# Patient Record
Sex: Female | Born: 1980 | Race: Black or African American | Hispanic: No | Marital: Single | Smoking: Never smoker
Health system: Southern US, Community
[De-identification: ages and names within clinical notes are randomized; demographics above are authoritative.]

---

## 2014-03-27 ENCOUNTER — Emergency Department (HOSPITAL_COMMUNITY)
Admission: EM | Admit: 2014-03-27 | Discharge: 2014-03-27 | Disposition: A | Discharge status: Home / Self Care | Payer: Self-pay | Attending: Emergency Medicine | Admitting: Emergency Medicine

## 2014-03-27 ENCOUNTER — Emergency Department (HOSPITAL_COMMUNITY): Payer: Self-pay

## 2014-03-27 ENCOUNTER — Encounter (HOSPITAL_COMMUNITY): Payer: Self-pay | Admitting: Emergency Medicine

## 2014-03-27 DIAGNOSIS — Z3202 Encounter for pregnancy test, result negative: Secondary | ICD-10-CM | POA: Insufficient documentation

## 2014-03-27 DIAGNOSIS — M542 Cervicalgia: Secondary | ICD-10-CM | POA: Insufficient documentation

## 2014-03-27 DIAGNOSIS — N6002 Solitary cyst of left breast: Secondary | ICD-10-CM | POA: Insufficient documentation

## 2014-03-27 DIAGNOSIS — K219 Gastro-esophageal reflux disease without esophagitis: Secondary | ICD-10-CM | POA: Insufficient documentation

## 2014-03-27 DIAGNOSIS — M79621 Pain in right upper arm: Secondary | ICD-10-CM | POA: Insufficient documentation

## 2014-03-27 DIAGNOSIS — N644 Mastodynia: Secondary | ICD-10-CM

## 2014-03-27 DIAGNOSIS — M79622 Pain in left upper arm: Secondary | ICD-10-CM | POA: Insufficient documentation

## 2014-03-27 LAB — COMPREHENSIVE METABOLIC PANEL
ALK PHOS: 38 U/L — AB (ref 39–117)
ALT: 19 U/L (ref 0–35)
ANION GAP: 12 (ref 5–15)
AST: 25 U/L (ref 0–37)
Albumin: 3.8 g/dL (ref 3.5–5.2)
BUN: 12 mg/dL (ref 6–23)
CO2: 25 meq/L (ref 19–32)
Calcium: 9.1 mg/dL (ref 8.4–10.5)
Chloride: 103 mEq/L (ref 96–112)
Creatinine, Ser: 0.74 mg/dL (ref 0.50–1.10)
Glucose, Bld: 96 mg/dL (ref 70–99)
Potassium: 4 mEq/L (ref 3.7–5.3)
SODIUM: 140 meq/L (ref 137–147)
Total Bilirubin: 0.6 mg/dL (ref 0.3–1.2)
Total Protein: 7.4 g/dL (ref 6.0–8.3)

## 2014-03-27 LAB — CBC WITH DIFFERENTIAL/PLATELET
Basophils Absolute: 0 10*3/uL (ref 0.0–0.1)
Basophils Relative: 0 % (ref 0–1)
Eosinophils Absolute: 0.1 10*3/uL (ref 0.0–0.7)
Eosinophils Relative: 1 % (ref 0–5)
HEMATOCRIT: 38.7 % (ref 36.0–46.0)
Hemoglobin: 12.2 g/dL (ref 12.0–15.0)
LYMPHS PCT: 38 % (ref 12–46)
Lymphs Abs: 1.7 10*3/uL (ref 0.7–4.0)
MCH: 29.5 pg (ref 26.0–34.0)
MCHC: 31.5 g/dL (ref 30.0–36.0)
MCV: 93.7 fL (ref 78.0–100.0)
Monocytes Absolute: 0.3 10*3/uL (ref 0.1–1.0)
Monocytes Relative: 7 % (ref 3–12)
Neutro Abs: 2.5 10*3/uL (ref 1.7–7.7)
Neutrophils Relative %: 54 % (ref 43–77)
PLATELETS: 257 10*3/uL (ref 150–400)
RBC: 4.13 MIL/uL (ref 3.87–5.11)
RDW: 12.5 % (ref 11.5–15.5)
WBC: 4.6 10*3/uL (ref 4.0–10.5)

## 2014-03-27 LAB — POC URINE PREG, ED: Preg Test, Ur: NEGATIVE

## 2014-03-27 LAB — TROPONIN I: Troponin I: <0.3 ng/mL (ref <0.30)

## 2014-03-27 NOTE — Discharge Instructions (Signed)
Please call your doctor for a followup appointment within 24-48 hours. When you talk to your doctor please let them know that you were seen in the emergency department and have them acquire all of your records so that they can discuss the findings with you and formulate a treatment plan to fully care for your new and ongoing problems. Please call [facility] to schedule an appointment for ultrasounds to be performed Please call and set-up an appointment with health and wellness center  Please rest and stay hydrated Please closely monitor the lesion of the left breast - if anything changes please report back to the ED Please continue to monitor symptoms closely and if symptoms are to worsen or change (fever greater than 101, chills, sweating, nausea, vomiting, chest pain, shortness of breathe, difficulty breathing, weakness, numbness, tingling, worsening or changes to pain pattern, breast swelling, drainage, redness, hot to the touch, changes to breast appearance and size, increased size of the cyst on the breast) please report back to the Emergency Department immediately.     Breast Cyst A breast cyst is a sac in the breast that is filled with fluid. Breast cysts are common in women. Women can have one or many cysts. When the breasts contain many cysts, it is usually due to a noncancerous (benign) condition called fibrocystic change. These lumps form under the influence of female hormones (estrogen and progesterone). The lumps are most often located in the upper, outer portion of the breast. They are often more swollen, painful, and tender before your period starts. They usually disappear after menopause, unless you are on hormone therapy.  There are several types of cysts:  Macrocyst. This is a cyst that is about 2 in. (5.1 cm) in diameter.   Microcyst. This is a tiny cyst that you cannot feel but can be seen with a mammogram or an ultrasound.   Galactocele. This is a cyst  containing milk that may develop if you suddenly stop breastfeeding.   Sebaceous cyst of the skin. This type of cyst is not in the breast tissue itself. Breast cysts do not increase your risk of breast cancer. However, they must be monitored closely because they can be cancerous.  CAUSES  It is not known exactly what causes a breast cyst to form. Possible causes include:  An overgrowth of milk glands and connective tissue in the breast can block the milk glands, causing them to fill with fluid.   Scar tissue in the breast from previous surgery may block the glands, causing a cyst.  RISK FACTORS Estrogen may influence the development of a breast cyst.  SIGNS AND SYMPTOMS   Feeling a smooth, round, soft lump (like a grape) in the breast that is easily moveable.   Breast discomfort or pain.  Increase in size of the lump before your menstrual period and decrease in its size after your menstrual period.  DIAGNOSIS  A cyst can be felt during a physical exam by your health care provider. A breast X-ray exam (mammogram) and ultrasonography will be done to confirm the diagnosis. Fluid may be removed from the cyst with a needle (fine needle aspiration) to make sure the cyst is not cancerous.  TREATMENT  Treatment may not be necessary. Your health care provider may monitor the cyst to see if it goes away on its own. If treatment is needed, it may include:  Hormone treatment.   Needle aspiration. There is a chance of the cyst coming back after aspiration.  Surgery to remove the whole cyst.  HOME CARE INSTRUCTIONS   Keep all follow-up appointments with your health care provider.  See your health care provider regularly:  Get a yearly exam by your health care provider.  Have a clinical breast exam by a health care provider every 1-3 years if you are 15-23 years of age. After age 18 years, you should have the exam every year.   Get mammogram tests as directed by your health care  provider.   Understand the normal appearance and feel of your breasts and perform breast self-exams.   Only take over-the-counter or prescription medicines as directed by your health care provider.   Wear a supportive bra, especially when exercising.   Avoid caffeine.   Reduce your salt intake, especially before your menstrual period. Too much salt can cause fluid retention, breast swelling, and discomfort.  SEEK MEDICAL CARE IF:   You feel, or think you feel, a lump in your breast.   You notice that both breasts look or feel different than usual.   Your breast is still causing pain after your menstrual period is over.   You need medicine for breast pain and swelling that occurs with your menstrual period.  SEEK IMMEDIATE MEDICAL CARE IF:   You have severe pain, tenderness, redness, or warmth in your breast.   You have nipple discharge or bleeding.   Your breast lump becomes hard and painful.   You find new lumps or bumps that were not there before.   You feel lumps in your armpit (axilla).   You notice dimpling or wrinkling of the breast or nipple.   You have a fever.  MAKE SURE YOU:  Understand these instructions.  Will watch your condition.  Will get help right away if you are not doing well or get worse. Document Released: 03/28/2005 Document Revised: 11/28/2012 Document Reviewed: 10/25/2012 [facility] Patient Information 2015 [place], [place]. This information is not intended to replace advice given to you by your health care provider. Make sure you discuss any questions you have with your health care provider.   Food Choices for Gastroesophageal Reflux Disease When you have gastroesophageal reflux disease (GERD), the foods you eat and your eating habits are very important. Choosing the right foods can help ease the discomfort of GERD. WHAT GENERAL GUIDELINES DO I NEED TO FOLLOW?  Choose fruits, vegetables, whole grains, low-fat dairy products, and  low-fat meat, fish, and poultry.  Limit fats such as oils, salad dressings, butter, nuts, and avocado.  Keep a food diary to identify foods that cause symptoms.  Avoid foods that cause reflux. These may be different for different people.  Eat frequent small meals instead of three large meals each day.  Eat your meals slowly, in a relaxed setting.  Limit fried foods.  Cook foods using methods other than frying.  Avoid drinking alcohol.  Avoid drinking large amounts of liquids with your meals.  Avoid bending over or lying down until 2-3 hours after eating. WHAT FOODS ARE NOT RECOMMENDED? The following are some foods and drinks that may worsen your symptoms: Vegetables Tomatoes. Tomato juice. Tomato and spaghetti sauce. Chili peppers. Onion and garlic. Horseradish. Fruits Oranges, grapefruit, and lemon (fruit and juice). Meats High-fat meats, fish, and poultry. This includes hot dogs, ribs, ham, sausage, salami, and bacon. Dairy Whole milk and chocolate milk. Sour cream. Cream. Butter. Ice cream. Cream cheese.  Beverages Coffee and tea, with or without caffeine. Carbonated beverages or energy drinks. Condiments Hot sauce. Barbecue  sauce.  Sweets/Desserts Chocolate and cocoa. Donuts. Peppermint and spearmint. Fats and Oils High-fat foods, including [place] fries and potato chips. Other Vinegar. Strong spices, such as black pepper, white pepper, red pepper, cayenne, curry powder, cloves, ginger, and chili powder. The items listed above may not be a complete list of foods and beverages to avoid. Contact your dietitian for more information. Document Released: 03/28/2005 Document Revised: 04/02/2013 Document Reviewed: 01/30/2013 [facility] Patient Information 2015 [place]. This information is not intended to replace advice given to you by your health care provider. Make sure you discuss any questions you have with your health care provider.    Emergency Department Resource  Guide 1) Find a Doctor and Pay Out of Pocket Although you won't have to find out who is covered by your insurance plan, it is a good idea to ask around and get recommendations. You will then need to call the office and see if the doctor you have chosen will accept you as a new patient and what types of options they offer for patients who are self-pay. Some doctors offer discounts or will set up payment plans for their patients who do not have insurance, but you will need to ask so you aren't surprised when you get to your appointment.  2) Contact Your Local Health Department Not all health departments have doctors that can see patients for sick visits, but many do, so it is worth a call to see if yours does. If you don't know where your local health department is, you can check in your phone book. The CDC also has a tool to help you locate your state's health department, and many state websites also have listings of all of their local health departments.  3) Find a Walk-in Clinic If your illness is not likely to be very severe or complicated, you may want to try a walk in clinic. These are popping up all over the country in pharmacies, drugstores, and shopping centers. They're usually staffed by nurse practitioners or physician assistants that have been trained to treat common illnesses and complaints. They're usually fairly quick and inexpensive. However, if you have serious medical issues or chronic medical problems, these are probably not your best option.  No Primary Care Doctor: - Call Health Connect at  [phone] - they can help you locate a primary care doctor that  accepts your insurance, provides certain services, etc. - Physician Referral Service- [phone]  Chronic Pain Problems: Organization         Address  Phone   Notes  [facility]  [phone] Patients need to be referred by their primary care doctor.   Medication Assistance: Organization          Address  Phone   Notes  [facility] [address] [place], [place] [address] [phone] --Must be a resident of [facility] [place] [facility] -- Must have NO insurance coverage whatsoever (no Medicaid/ Medicare, etc.) -- The pt. MUST have a primary care doctor that directs their care regularly and follows them in the community   MedAssist  [phone]   [employer]  [phone]    Agencies that provide inexpensive medical care: Organization         Address  Phone   Notes  [facility]  [phone]   [facility]    [phone]   [facility] [address] [place], [place] [address] [phone]   [facility] [address] [place] [phone]   Planned Parenthood    [phone]   [facility]    [phone]   [facility]  [address], [place] Phone:  [phone], Fax:  [phone] Hours of Operation:  9 am - 6 pm, M-F.  Also accepts Medicaid/Medicare and self-pay.  [facility]  [address], [place] Phone: [phone], Fax: [phone]. Hours of Operation:  8:30 am - 5:30 pm, M-F.  Also accepts Medicaid and self-pay.  [facility] [place] [address], [place] Phone: [phone]   [facility] [address] Natasha Bence [place], [place] [phone], Ext. [phone] Mondays & Thursdays: 7-9 AM.  First 15 patients are seen on a first come, first serve basis.    Medicaid-accepting [facility] Providers:  Organization         Address  Phone   Notes  [facility] [address], [place] [phone] Also accepts self-pay patients.  [facility] [address] Laurell Josephs [place], [place]  [phone]   [facility] [address], [place] [phone]   [facility] [address], [place] [phone]   Renaye Rakers [address], [place]   [phone] Only accepts [place] Access [place] patients after they have their name applied to their card.   Self-Pay (no insurance) in [facility]:  Organization         Address  Phone   Notes  Sickle Cell Patients, [facility] [address] [place], [place] [phone]   [facility] [address] [place], [place] [phone]   [facility]  [address], [place] [phone]   [facility]/Dr. Osei-Bonsu  [address], [place] or [address], [place] [phone] Phone number for both [place] and [place] locations is the same.  [facility] [address], [place] [phone]   [facility] [address] [place] or [address] [phone]   [facility] [address], [place] [phone], phone; [phone], fax Sees patients 1st and 3rd Saturday of every month.  Must not qualify for public or private insurance (i.e. Medicaid, Medicare, [place] Health Choice, Veterans' Benefits)  Household income should be no more than 200% of the poverty level The clinic cannot treat you if you are pregnant or think you are pregnant  Sexually transmitted diseases are not treated at the clinic.    Dental Care: Organization         Address  Phone  Notes  [facility] Department of [facility] [address] [place], [place] [phone] Accepts children up to age 29 who are enrolled in [place] or [place] Health Choice; pregnant women with a Medicaid card; and children who have applied for Medicaid or [place] Health Choice, but were declined, whose parents can pay a reduced fee at time of service.  [facility] Department of [facility]  [address], [place] [phone] Accepts children up to age 74 who are enrolled in [place] or [place] Health Choice; pregnant women with a  Medicaid card; and children who have applied for Medicaid or [place] Health Choice, but were declined, whose parents can pay a reduced fee at time of service.  [facility]  [address] [place], [place] [phone] Patients are seen by appointment only. Walk-ins are not accepted. [facility] will see patients 14 years of age and older. Monday - Tuesday (8am-5pm) Most Wednesdays (8:30-5pm) $30 per visit, cash only  [facility]  [address], [facility] [phone] Patients are seen by appointment only. Walk-ins are not accepted. [facility] will see patients 69 years of age and older. One Wednesday Evening (Monthly: Volunteer Based).  $30 per visit, cash only  [employer]  [phone] for adults; Children under age 66, call Graduate Pediatric Dentistry at [phone]. Children aged 14-14, please call [phone] to request a pediatric application.  Dental services are provided in all areas of dental care including fillings, crowns and bridges, complete and partial dentures, implants, gum treatment, root canals, and extractions. Preventive care is also provided. Treatment is provided to both adults and children. Patients are selected via a lottery and there is often a waiting list.   [facility] [address] [place]  [phone] [email or web address]   [facility] [address] [place], [place] [phone], Ext. [phone] Second and Fourth Thursday of each month, opens at 6:30 AM; Clinic ends at 9 AM.  Patients are seen on a first-come first-served basis, and a limited number are seen during each clinic.   [facility]  [address] Ether Griffins [place], [place] [phone]   Eligibility Requirements You must  have lived in [place], [place], or [place] counties for at least the last three months.   You cannot be eligible for state or federal sponsored [place], including [employer], [place], or [employer].   You generally cannot be eligible for healthcare insurance through your employer.    How to apply: Eligibility screenings are held every Tuesday and Wednesday afternoon from 1:00 pm until 4:00 pm. You do not need an appointment for the interview!  [facility] [address] [place], [place] [phone]   [facility]  [phone]   [facility] Health Department  [phone]   [facility] Health Department  [phone]    Behavioral Health Resources in the Community: Intensive Outpatient Programs Organization         Address  Phone  Notes  [facility] [address], [place], [place] [phone]   [facility] [address] [place], [place] [phone]   [facility] [address] [place], [place]  [phone]   [facility] Health [address],  [place], [place] [phone] or [phone]   Substance Abuse Resources Organization         Address  Phone  Notes  [facility] Drug Services  [phone]   [facility]  [phone]   [facility]  [phone]   Floydene Flock  [phone]   Residential & Outpatient Substance Abuse Program  [phone]   Psychological Services Organization         Address  Phone  Notes  [facility] Health  [phone]   [facility] Services  [phone]   [facility] [address] [place] [phone] or [phone]    Mobile Crisis Teams Organization  Address  Phone  Notes  [facility], Mobile Crisis Care Unit  [phone]   [facility]  [address] [place], [place] [phone]   [facility] [address] [place] [phone]    Self-Help/Support Groups Organization         Address  Phone             Notes  [facility] [place] - variety of support groups  [phone]- I7437963 Call for more information  Narcotics Anonymous (NA), [facility] [address], [employer] [place]  2 meetings at this location   Statistician         Address  Phone  Notes  [facility] [address],    [place]  [phone]   [facility]  [address], [place] [address], [place], [place] [phone]   [facility] [address] [place], [place] [phone] Admissions: 8am-3pm M-F  [facility] [address]    [place], [place] [phone]   [facility] [address] [place], [place], [place] [phone]   [facility] [address]  [place], [place] [phone]   [facility] - Intensive Outpatient [address] Laurell Josephs [address], [place], [place] [phone]   [facility] (Addiction Recovery [facility] [address] [place].,  [place], [place] [phone] or [phone]   [facility] ([facility]) [address], [place], [place] [phone] Accepts Medicaid  [facility] [address]  [place] [phone] Substance Abuse/Addiction Treatment   [facility] Organization         Address  Phone  Notes  [facility]  [phone]   Angie Fava, PhD [address] Ervin Knack [place], [place]   [phone] or [phone]   [facility] Behavioral   [address] [place], [place] [phone]   [facility] [address], [place], [place] [phone] Insurance/Medicaid/sponsorship through [facility] [address]                                    [place], [place] [phone] Therapy/tele-psych/case  [facility] [address], [place] [phone]    Dr. Lolly Mustache  [phone]   [facility]  [employer] [facility] Dept. 1) [address]  2) [address], [place] 3)  [address], [place] [phone]  [phone]   [facility] Child Abuse Hotline (567) 088-2320 or [phone] (After Hours)

## 2014-03-27 NOTE — ED Provider Notes (Addendum)
CSN: [id number]     Arrival date & time 03/27/14  1715 History   First MD Initiated Contact with Patient 03/27/14 1732    This chart was scribed for non-physician practitioner, Raymon MuttonMarissa Lyndee Herbst, PA, working with No att. providers found by Marica OtterNusrat Rahman, ED Scribe. This patient was seen in room WTR9/WTR9 and the patient's care was started at 6:28 PM.  Chief Complaint  Patient presents with  . Breast Pain    L breast, x2 weeks   The history is provided by the patient. No language interpreter was used.   PCP: No primary care provider on file. HPI Comments: Jenny Bender is a 33 y.o. female, with PMHx of GERD and neck pain presenting to the ED with left breast pain that has been ongoing for approximately one week. Patient reported that she has noticed a lump on her left breast that is painful upon palpation and stated that the size has gotten larger over the past couple of days. Reported that the discomfort is localized to the left breast, but stated that the pain does radiate to the left shoulder and left side of the neck at times. Stated that she has been using Iron and Aleve with minimal relief. Stated that she does not have history of breast cancer, DM, HTN. Denied chest pain, shortness of breath, difficulty breathing, fever, chills, swelling to the nipple, drainage or pus drainage from the nipple, nausea, vomiting, diarrhea, abdominal pain, trauma, melena, tingling jaw pain, blurred vision, sudden loss of vision, cough, nasal congestion, hematochezia. PCP none   History reviewed. No pertinent past medical history. History reviewed. No pertinent past surgical history. No family history on file. History  Substance Use Topics  . Smoking status: Never Smoker   . Smokeless tobacco: Not on file  . Alcohol Use: No   OB History    No data available     Review of Systems  Constitutional: Negative for fever and chills.  HENT: Negative for congestion and trouble swallowing.   Eyes: Negative for  visual disturbance.  Respiratory: Negative for cough and shortness of breath.   Cardiovascular: Negative for chest pain and leg swelling.  Gastrointestinal: Negative for vomiting, abdominal pain and blood in stool.  Musculoskeletal: Positive for neck pain (at baseline ).       Left breast pain; upper arm pain bilaterally   Skin: Negative for color change and wound.       Small bump on left breast  Neurological: Negative for numbness.  Psychiatric/Behavioral: Negative for confusion.      Allergies  Review of patient's allergies indicates no known allergies.  Home Medications   Prior to Admission medications   Not on File   Triage Vitals: BP 108/65 mmHg  Pulse 73  Temp(Src) 98.6 F (37 C) (Oral)  Resp 18  SpO2 99%  LMP 03/21/2014 Physical Exam  Constitutional: She is oriented to person, place, and time. She appears well-developed and well-nourished. No distress.  HENT:  Head: Normocephalic and atraumatic.  Mouth/Throat: Oropharynx is clear and moist. No oropharyngeal exudate.  Eyes: Conjunctivae and EOM are normal. Pupils are equal, round, and reactive to light.  Neck: Normal range of motion. Neck supple. No tracheal deviation present.  Negative neck stiffness Negative nuchal rigidity Negative cervical lymphadenopathy  Negative meningeal signs  Cardiovascular: Normal rate, regular rhythm and normal heart sounds.  Exam reveals no friction rub.   No murmur heard. Pulses:      Radial pulses are 2+ on the right side, and 2+  on the left side.  Pulmonary/Chest: Effort normal and breath sounds normal. No respiratory distress. She has no wheezes. She has no rales. She exhibits no tenderness.    Breast exam: Negative swelling, erythema, inflammation, lesions, sores, deformities, asymmetry identified to the breasts bilaterally. Mild inversion of the nipples bilaterally. Negative abnormalities identified to the areola. Negative active drainage or bleeding noted from the nipples.  Negative findings of mastitis. Negative peu d'orange. Small, mobile cystlike lesion measuring approximately 0.5 cm x 0.5 cm identified to the left upper quadrant of the left breast-negative surrounding erythema or signs of cellulitic infection noted. Discomfort upon palpation. Negative lymphadenopathy bilaterally.  Musculoskeletal: Normal range of motion.  Full ROM to upper and lower extremities without difficulty noted, negative ataxia noted.  Lymphadenopathy:    She has no cervical adenopathy.  Neurological: She is alert and oriented to person, place, and time. No cranial nerve deficit. She exhibits normal muscle tone. Coordination normal.  Cranial nerves III-XII grossly intact Strength 5+/5+ to upper and lower extremities bilaterally with resistance applied, equal distribution noted Equal grip strength  Negative arm drift Fine motor skills intact Patient responds to questions Patient follows commands well   Skin: Skin is warm and dry. No rash noted. She is not diaphoretic. No erythema.  Psychiatric: She has a normal mood and affect. Her behavior is normal. Thought content normal.  Nursing note and vitals reviewed.   ED Course  Procedures (including critical care time) DIAGNOSTIC STUDIES: Oxygen Saturation is 99% on RA, nl by my interpretation.    COORDINATION OF CARE: 6:48 PM-Discussed treatment plan which includes breast exam, labs, and EKG with pt at bedside and pt agreed to plan. Discussed discontinuing use of Iron supplements unless there is a medical reason to do so.    Results for orders placed or performed during the hospital encounter of 03/27/14  CBC with Differential  Result Value Ref Range   WBC 4.6 4.0 - 10.5 K/uL   RBC 4.13 3.87 - 5.11 MIL/uL   Hemoglobin 12.2 12.0 - 15.0 g/dL   HCT 16.138.7 09.636.0 - 04.546.0 %   MCV 93.7 78.0 - 100.0 fL   MCH 29.5 26.0 - 34.0 pg   MCHC 31.5 30.0 - 36.0 g/dL   RDW 40.912.5 81.111.5 - 91.415.5 %   Platelets 257 150 - 400 K/uL   Neutrophils Relative %  54 43 - 77 %   Neutro Abs 2.5 1.7 - 7.7 K/uL   Lymphocytes Relative 38 12 - 46 %   Lymphs Abs 1.7 0.7 - 4.0 K/uL   Monocytes Relative 7 3 - 12 %   Monocytes Absolute 0.3 0.1 - 1.0 K/uL   Eosinophils Relative 1 0 - 5 %   Eosinophils Absolute 0.1 0.0 - 0.7 K/uL   Basophils Relative 0 0 - 1 %   Basophils Absolute 0.0 0.0 - 0.1 K/uL  Comprehensive metabolic panel  Result Value Ref Range   Sodium 140 137 - 147 mEq/L   Potassium 4.0 3.7 - 5.3 mEq/L   Chloride 103 96 - 112 mEq/L   CO2 25 19 - 32 mEq/L   Glucose, Bld 96 70 - 99 mg/dL   BUN 12 6 - 23 mg/dL   Creatinine, Ser 7.820.74 0.50 - 1.10 mg/dL   Calcium 9.1 8.4 - 95.610.5 mg/dL   Total Protein 7.4 6.0 - 8.3 g/dL   Albumin 3.8 3.5 - 5.2 g/dL   AST 25 0 - 37 U/L   ALT 19 0 - 35 U/L  Alkaline Phosphatase 38 (L) 39 - 117 U/L   Total Bilirubin 0.6 0.3 - 1.2 mg/dL   GFR calc non Af Amer >90 >90 mL/min   GFR calc Af Amer >90 >90 mL/min   Anion gap 12 5 - 15  Troponin I  Result Value Ref Range   Troponin I <0.30 <0.30 ng/mL  POC urine preg, ED (not at [facility])  Result Value Ref Range   Preg Test, Ur NEGATIVE NEGATIVE    Labs Review Labs Reviewed  COMPREHENSIVE METABOLIC PANEL - Abnormal; Notable for the following:    Alkaline Phosphatase 38 (*)    All other components within normal limits  CBC WITH DIFFERENTIAL  TROPONIN I  POC URINE PREG, ED    Imaging Review Dg Chest 2 View  03/27/2014   CLINICAL DATA:  Acute left-sided breast pain.  EXAM: CHEST  2 VIEW  COMPARISON:  None.  FINDINGS: The heart size and mediastinal contours are within normal limits. Both lungs are clear. No pneumothorax or pleural effusion is noted. The visualized skeletal structures are unremarkable.  IMPRESSION: No acute cardiopulmonary abnormality seen.   Electronically Signed   By: Roque Lias M.D.   On: 03/27/2014 19:58     EKG Interpretation   Date/Time:  Thursday March 27 2014 19:10:58 EST Ventricular Rate:  64 PR Interval:  138 QRS Duration: 109 QT  Interval:  411 QTC Calculation: 424 R Axis:   -42 Text Interpretation:  Sinus rhythm Ventricular trigeminy Left axis  deviation Low voltage, precordial leads RSR' in V1 or V2, probably normal  variant Borderline T abnormalities, lateral leads No old tracing to  compare Confirmed by [facility]  MD, MARTHA [phone]) on 03/27/2014 7:22:08 PM       9:21 PM This provider had a long discussion with patient and husband regarding labs and imaging. Discussed with patient and husband [place] to be performed tomorrow for ruling out of cyst. Patient then proceeded to have a lot of questions. Patient reported that for the past 2 years she has been having issues with GERD - stated that she does cook with spicy foods and eats at night time. Stated that she has a burning sensation with she lays down flat and has the sensation that she needs to pass gas by belching. Husband reported that patient cooks with red pepper flakes, peppers, chili powder. Patient reported that she does eat before she goes to bed. Denied hemoptysis, hematemesis, hematochezia, melena.  Negative abdominal distension. BS normoactive in all 4 quadrants. Abdomen soft upon palpation. Negative pain upon palpation. Negative peritoneal signs.  Discussed with patient that she needs to decreased spicy intake, not eat before going to bed and increase water intake. Patient asked for referral - given in discharge paperwork.    MDM   Final diagnoses:  Breast pain, left  Breast cyst, left  Gastroesophageal reflux disease, esophagitis presence not specified    Medications - No data to display   Filed Vitals:   03/27/14 1724  BP: 108/65  Pulse: 73  Temp: 98.6 F (37 C)  TempSrc: Oral  Resp: 18  SpO2: 99%   I personally performed the services described in this documentation, which was scribed in my presence. The recorded information has been reviewed and is accurate.  EKG noted sinus rhythm with ventricular trigeminy with left axis deviation, heart  rate 64 bpm. troponin negative elevation. CBC unremarkable-hemoglobin 12.2, hematocrit 38.7. CMP unremarkable-blood lites unremarkable. Glucose 96-negative elevated anion gap, 12.0 mg/L. Urine pregnancy negative. Chest x-ray negative  for acute cardiopulmonary disease. Doubt ACS-EKG unremarkable with negative elevated troponin after a week of left breast/chest discomfort. Doubt pneumonia. Doubt meningitis. Negative findings of mastitis. Doubt breast abscess. Cyst-like lesion palpated at the right upper quadrant of the left breast - negative findings of cellulitis infection. Patient stable, afebrile. Patient not septic appearing. Discharged patient. Discussed with patient to rest and stay hydrated. Discussed with patient that breast US bilaterally has been ordered and that she is to call [facility] and get an appointment to be seen. Discussed with patient importance of breast exams. Discussed with patient to monitor the size of the cyst. Discussed with patient to closely monitor symptoms and if symptoms are to worsen or change to report back to the ED - strict return instructions given.  Patient agreed to plan of care, understood, all questions answered.   Raymon Mutton, PA-C 03/27/14 2106  Ethelda Chick, MD 03/27/14 2108  Raymon Mutton, PA-C 03/27/14 [id number]  Ethelda Chick, MD 03/27/14 [phone]

## 2014-03-27 NOTE — ED Notes (Signed)
Pt A+Ox4, reports L breast pain x2 weeks, sharp, intermittent, lasting seconds.  Pt also reports a "bump", small dime sized area of tenderness noted.  Pt also reports "just don't feel good".  Skin PWD.  Speaking full/clear sentences, rr even/un-lab.  NAD.

## 2014-03-28 ENCOUNTER — Other Ambulatory Visit (HOSPITAL_COMMUNITY): Payer: Self-pay | Admitting: Physician Assistant

## 2014-03-28 DIAGNOSIS — N644 Mastodynia: Secondary | ICD-10-CM

## 2014-03-28 DIAGNOSIS — N6002 Solitary cyst of left breast: Secondary | ICD-10-CM

## 2015-08-07 IMAGING — CR DG CHEST 2V
2 series · 2 of 2 positions shown · non-contrast
Comparison: None.

CLINICAL DATA: Acute left-sided breast pain.

EXAM:
CHEST  2 VIEW

[w chest pa]
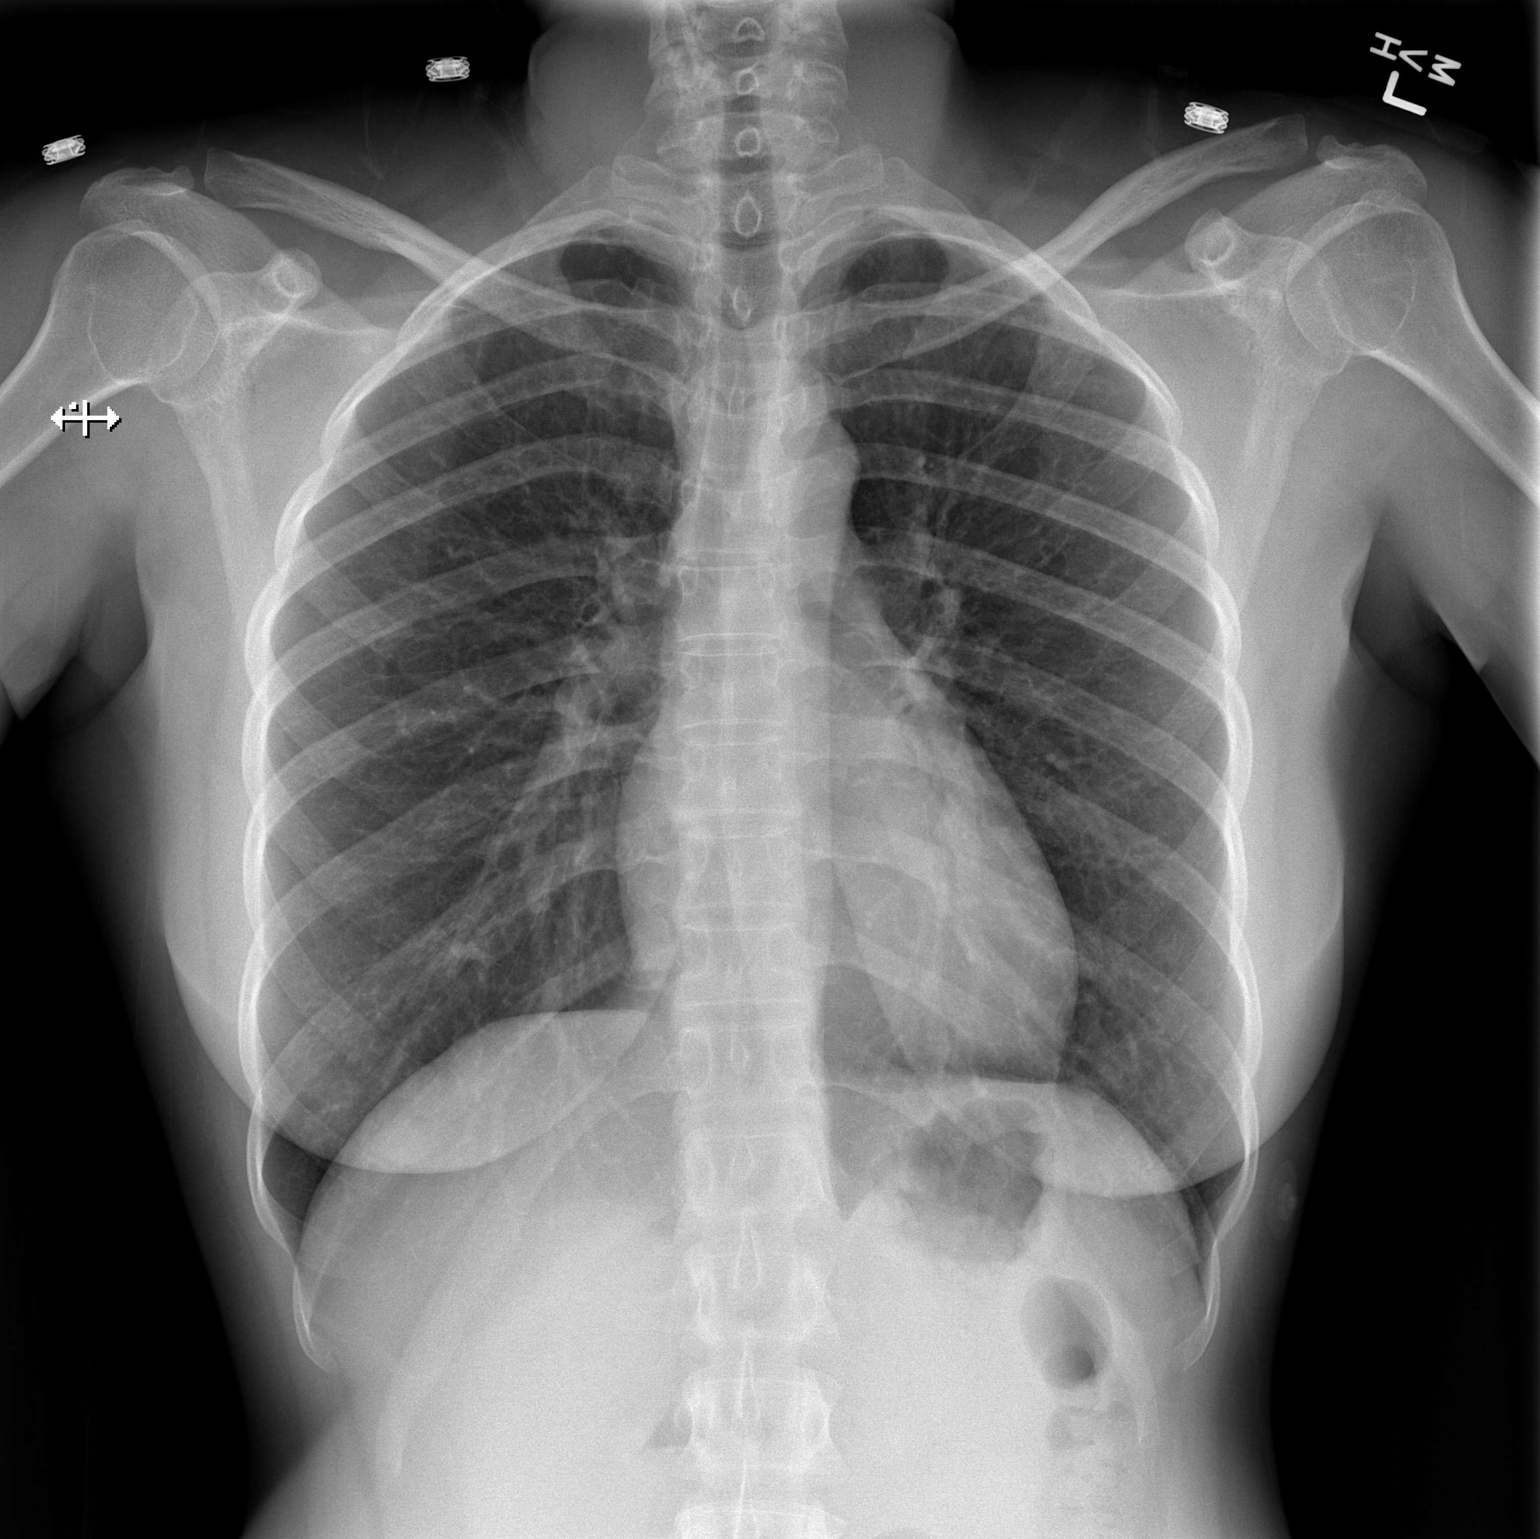

[w chest lat]
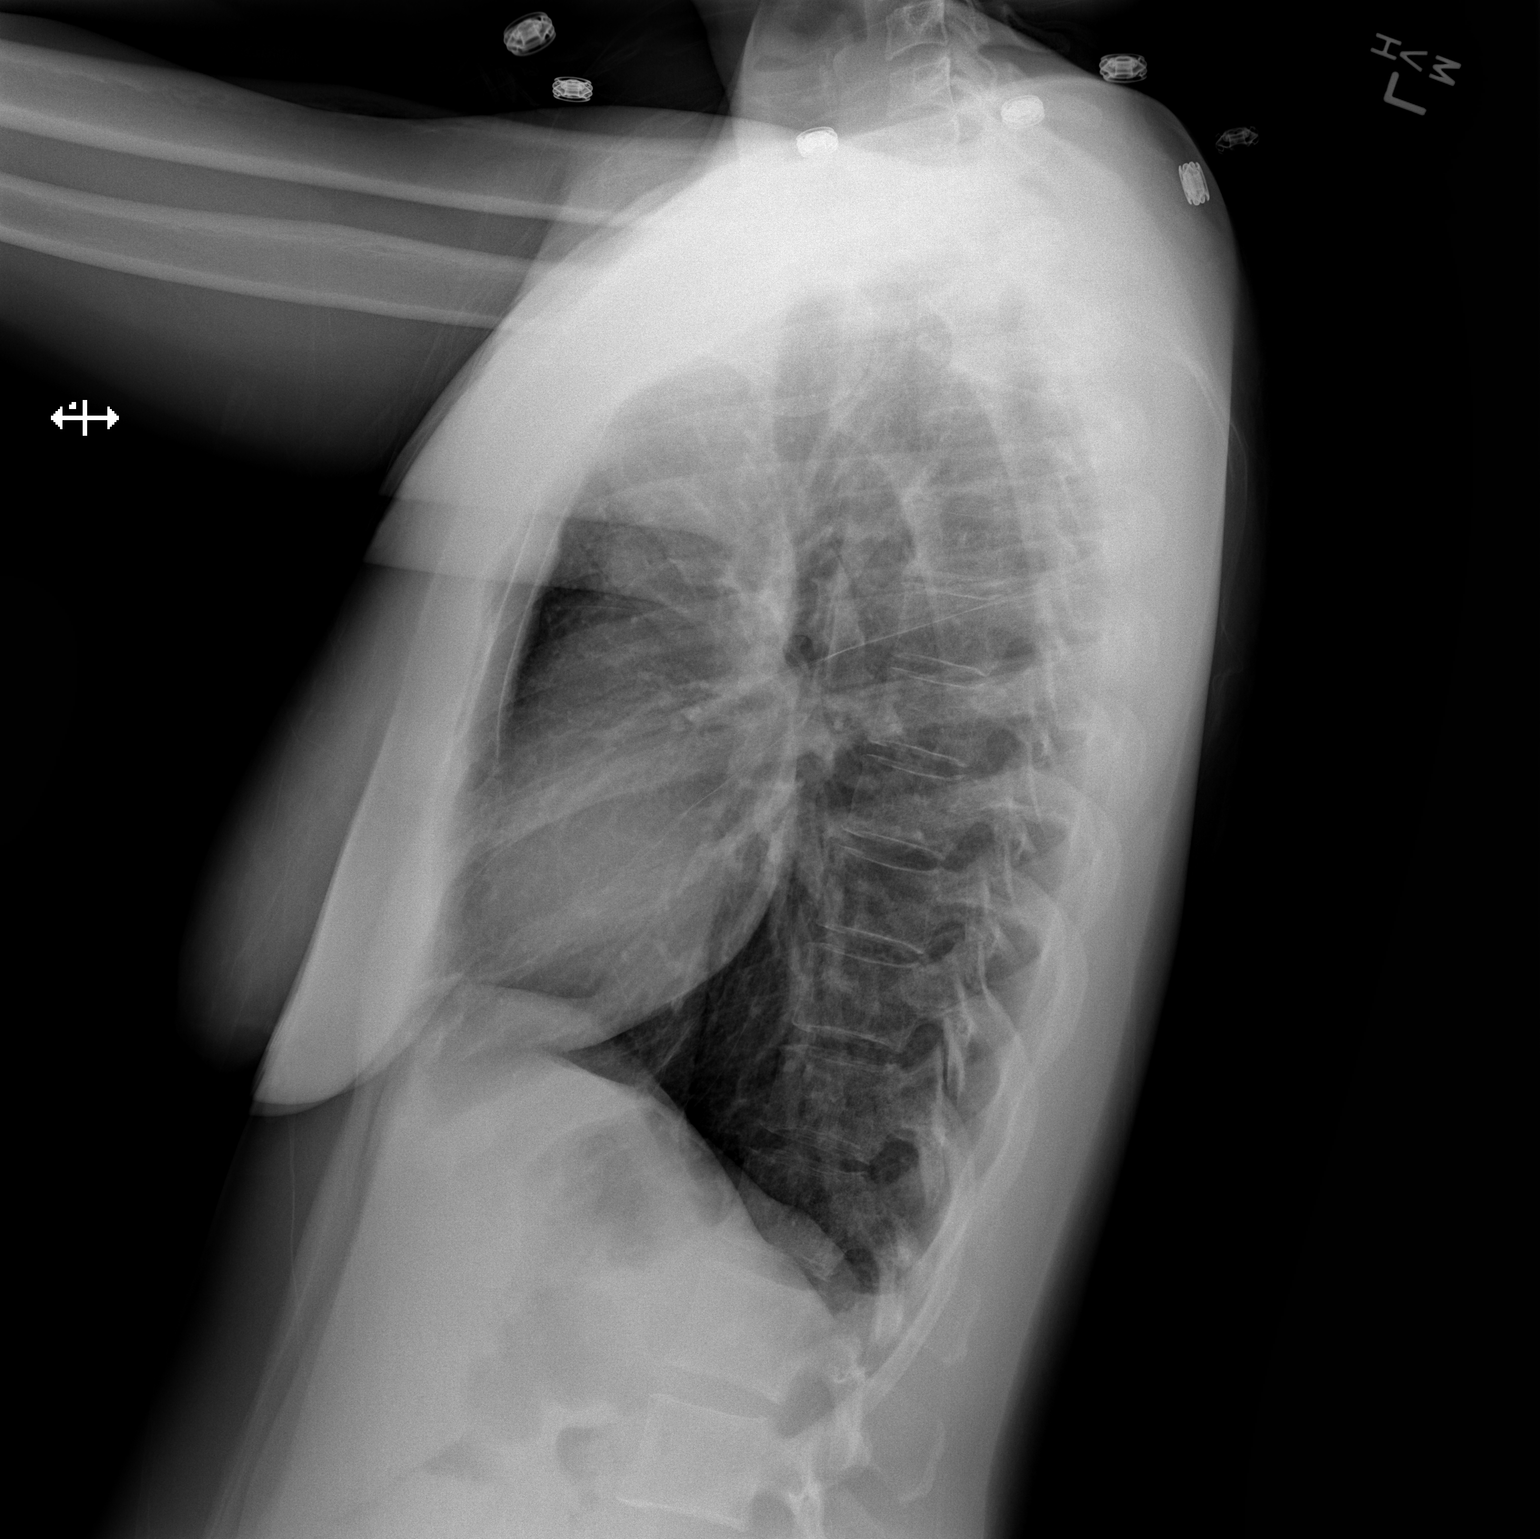

[2 of 2 positions shown; findings below may reference images not displayed]

FINDINGS: The heart size and mediastinal contours are within normal limits.
Both lungs are clear. No pneumothorax or pleural effusion is noted.
The visualized skeletal structures are unremarkable.
IMPRESSION: No acute cardiopulmonary abnormality seen.
